# Patient Record
Sex: Male | Born: 1985 | Race: White | Hispanic: No | Marital: Single | Smoking: Current every day smoker
Health system: Southern US, Community
[De-identification: ages and names within clinical notes are randomized; demographics above are authoritative.]

## PROBLEM LIST (undated history)

## (undated) HISTORY — PX: OTHER SURGICAL HISTORY: SHX169

---
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
## 2013-06-26 ENCOUNTER — Emergency Department (HOSPITAL_COMMUNITY)
Admission: EM | Admit: 2013-06-26 | Discharge: 2013-06-27 | Disposition: A | Discharge status: Home / Self Care | Payer: BC Managed Care – PPO | Attending: Emergency Medicine | Admitting: Emergency Medicine

## 2013-06-26 ENCOUNTER — Emergency Department (HOSPITAL_COMMUNITY): Payer: BC Managed Care – PPO

## 2013-06-26 ENCOUNTER — Encounter (HOSPITAL_COMMUNITY): Payer: Self-pay | Admitting: Emergency Medicine

## 2013-06-26 DIAGNOSIS — K828 Other specified diseases of gallbladder: Secondary | ICD-10-CM | POA: Insufficient documentation

## 2013-06-26 DIAGNOSIS — K8050 Calculus of bile duct without cholangitis or cholecystitis without obstruction: Secondary | ICD-10-CM

## 2013-06-26 DIAGNOSIS — K8020 Calculus of gallbladder without cholecystitis without obstruction: Secondary | ICD-10-CM | POA: Insufficient documentation

## 2013-06-26 DIAGNOSIS — F17200 Nicotine dependence, unspecified, uncomplicated: Secondary | ICD-10-CM | POA: Insufficient documentation

## 2013-06-26 LAB — URINALYSIS, ROUTINE W REFLEX MICROSCOPIC
Bilirubin Urine: NEGATIVE
Glucose, UA: NEGATIVE mg/dL
Hgb urine dipstick: NEGATIVE
Ketones, ur: NEGATIVE mg/dL
Leukocytes, UA: NEGATIVE
Nitrite: NEGATIVE
Protein, ur: NEGATIVE mg/dL
Specific Gravity, Urine: 1.027 (ref 1.005–1.030)
Urobilinogen, UA: 1 mg/dL (ref 0.0–1.0)
pH: 6.5 (ref 5.0–8.0)

## 2013-06-26 LAB — CBC WITH DIFFERENTIAL/PLATELET
Basophils Absolute: 0 10*3/uL (ref 0.0–0.1)
Basophils Relative: 0 % (ref 0–1)
Eosinophils Absolute: 0.3 10*3/uL (ref 0.0–0.7)
Eosinophils Relative: 3 % (ref 0–5)
HCT: 44.4 % (ref 39.0–52.0)
Hemoglobin: 16.3 g/dL (ref 13.0–17.0)
Lymphocytes Relative: 28 % (ref 12–46)
Lymphs Abs: 3 10*3/uL (ref 0.7–4.0)
MCH: 32 pg (ref 26.0–34.0)
MCHC: 36.7 g/dL — ABNORMAL HIGH (ref 30.0–36.0)
MCV: 87.2 fL (ref 78.0–100.0)
Monocytes Absolute: 0.8 10*3/uL (ref 0.1–1.0)
Monocytes Relative: 8 % (ref 3–12)
Neutro Abs: 6.4 10*3/uL (ref 1.7–7.7)
Neutrophils Relative %: 61 % (ref 43–77)
Platelets: 210 10*3/uL (ref 150–400)
RBC: 5.09 MIL/uL (ref 4.22–5.81)
RDW: 12.1 % (ref 11.5–15.5)
WBC: 10.5 10*3/uL (ref 4.0–10.5)

## 2013-06-26 LAB — COMPREHENSIVE METABOLIC PANEL
ALT: 92 U/L — ABNORMAL HIGH (ref 0–53)
AST: 52 U/L — ABNORMAL HIGH (ref 0–37)
Albumin: 4.4 g/dL (ref 3.5–5.2)
Alkaline Phosphatase: 91 U/L (ref 39–117)
BUN: 16 mg/dL (ref 6–23)
CO2: 24 mEq/L (ref 19–32)
Calcium: 9.8 mg/dL (ref 8.4–10.5)
Chloride: 100 mEq/L (ref 96–112)
Creatinine, Ser: 1.11 mg/dL (ref 0.50–1.35)
GFR calc Af Amer: >90 mL/min (ref >90)
GFR calc non Af Amer: >90 mL/min (ref >90)
Glucose, Bld: 108 mg/dL — ABNORMAL HIGH (ref 70–99)
Potassium: 3.7 mEq/L (ref 3.7–5.3)
Sodium: 139 mEq/L (ref 137–147)
Total Bilirubin: 0.4 mg/dL (ref 0.3–1.2)
Total Protein: 7.5 g/dL (ref 6.0–8.3)

## 2013-06-26 LAB — LIPASE, BLOOD: Lipase: 18 U/L (ref 11–59)

## 2013-06-26 NOTE — ED Notes (Signed)
Pt reports upper mid abdominal pain for the past week, constant but increasing at night. Pt denies n/v/d. Pt states he has taken gas relief medications with mild relief. PT a&o x4, NAD noted at this time.

## 2013-06-26 NOTE — ED Provider Notes (Signed)
CSN: [id number]     Arrival date & time 06/26/13  2039 History   First MD Initiated Contact with Patient 06/26/13 2106     Chief Complaint  Patient presents with  . Abdominal Pain    (Consider location/radiation/quality/duration/timing/severity/associated sxs/prior Treatment) HPI Comments: Patient is a 28 year old male with no significant past medical history who presents to the emergency department for abdominal pain. Patient states the pain has been present in his epigastric region for the past week. Pain is intermittent in nature, but worsening. Patient states the pain is worse when lying flat and at night. He states that symptoms are sometimes mildly improved when standing upright. He has been taking Gas-X without relief of symptoms. He denies any factors which bring about his symptoms. Patient further denies associated fever, chest pain, shortness of breath, nausea, vomiting, diarrhea, penile swelling or discharge, scrotal swelling or testicular tenderness, dysuria or hematuria, melena or hematochezia, numbness/tingling, weakness, and syncope. Patient denies a history of abdominal surgeries. Patient endorses a normal bowel movement today which was free of blood.  The history is provided by the patient. No language interpreter was used.    History reviewed. No pertinent past medical history. Past Surgical History  Procedure Laterality Date  . Wisdom teeth    . Wisdom tooth extraction Bilateral 2004    x3   History reviewed. No pertinent family history. History  Substance Use Topics  . Smoking status: Current Every Day Smoker -- 0.50 packs/day    Types: Cigarettes  . Smokeless tobacco: Never Used  . Alcohol Use: No     Comment: pt reports quit drinking x1 mo, before drank 6 beers a day    Review of Systems  Constitutional: Negative for fever.  Respiratory: Negative for shortness of breath.   Cardiovascular: Negative for chest pain.  Gastrointestinal: Positive for abdominal pain.  Negative for nausea, vomiting, diarrhea, constipation and blood in stool.  Genitourinary: Negative for dysuria, hematuria, discharge, penile swelling, scrotal swelling, penile pain and testicular pain.  Neurological: Negative for syncope, weakness and numbness.  All other systems reviewed and are negative.     Allergies  Review of patient's allergies indicates no known allergies.  Home Medications   Prior to Admission medications   Not on File   BP 109/55  Pulse 53  Temp(Src) 98.9 F (37.2 C) (Oral)  Resp 20  SpO2 100%  Physical Exam  Nursing note and vitals reviewed. Constitutional: He is oriented to person, place, and time. He appears well-developed and well-nourished. No distress.  Patient in no visible or audible discomfort. He is in no acute distress.  HENT:  Head: Normocephalic and atraumatic.  Mouth/Throat: Oropharynx is clear and moist. No oropharyngeal exudate.  Eyes: Conjunctivae and EOM are normal. Pupils are equal, round, and reactive to light. No scleral icterus.  Neck: Normal range of motion.  Cardiovascular: Normal rate, regular rhythm and normal heart sounds.   Pulmonary/Chest: Effort normal and breath sounds normal. No respiratory distress. He has no wheezes. He has no rales.  Abdominal: Soft. He exhibits no distension and no mass. There is no tenderness. There is no rebound and no guarding.  No tenderness to palpation. No peritoneal signs. Appearance of the abdomen is normal.  Musculoskeletal: Normal range of motion.  Neurological: He is alert and oriented to person, place, and time.  GCS 15. Speech is goal oriented. Patient moves extremities without ataxia.  Skin: Skin is warm and dry. No rash noted. He is not diaphoretic. No erythema. No pallor.  Psychiatric: He has a normal mood and affect. His behavior is normal.    ED Course  Procedures (including critical care time) Labs Review Labs Reviewed  CBC WITH DIFFERENTIAL - Abnormal; Notable for the  following:    MCHC 36.7 (*)    All other components within normal limits  COMPREHENSIVE METABOLIC PANEL - Abnormal; Notable for the following:    Glucose, Bld 108 (*)    AST 52 (*)    ALT 92 (*)    All other components within normal limits  LIPASE, BLOOD  URINALYSIS, ROUTINE W REFLEX MICROSCOPIC    Imaging Review US Abdomen Complete  06/26/2013   CLINICAL DATA:  Epigastric and right upper quadrant pain.  EXAM: ULTRASOUND ABDOMEN COMPLETE  COMPARISON:  None.  FINDINGS: Gallbladder:  Diffusely increased echotexture throughout the gallbladder suggesting sludge filled gallbladder. There is adjacent gallbladder wall thickening and focal edema. No discrete stones are identified, the stones could be obscured by the large amount of sludge. Murphy's sign was given as negative but the patient did note pain and of the xiphoid during scanning the gallbladder.  Common bile duct:  Diameter: 4.9 mm, normal  Liver:  No focal lesion identified. Within normal limits in parenchymal echogenicity.  IVC:  No abnormality visualized.  Pancreas:  Poorly visualized due to overlying bowel gas.  Spleen:  Size and appearance within normal limits.  Right Kidney:  Length: 10.7 cm. Echogenicity within normal limits. No mass or hydronephrosis visualized.  Left Kidney:  Length: 10.2 cm. Echogenicity within normal limits. No mass or hydronephrosis visualized.  Abdominal aorta:  No aneurysm visualized.  Other findings:  None.  IMPRESSION: Sludge filled gallbladder with wall thickening and mild pericholecystic edema. Nonspecific Murphy's sign. Changes are nonspecific but likely represent cholecystitis in the appropriate clinical setting.   Electronically Signed   By: Lucienne Capers M.D.   On: 06/26/2013 23:58     EKG Interpretation None      MDM   Final diagnoses:  Biliary colic  Gallbladder sludge    28 male presents for epigastric pain which has been intermittent for one week. He is well and nontoxic appearing,  hemodynamically stable, and afebrile on arrival. Physical exam without significant findings. Patient has no abdominal tenderness to palpation. No Murphy sign. No tenderness at McBurney point. He has no leukocytosis today. Kidney function preserved. LFTs mildly elevated with normal alkaline phosphatase and total bilirubin. Lipase WNL. Given symptoms and mildly elevated LFTs, abdominal ultrasound obtained. Imaging today shows a slight filled gallbladder with wall thickening and mild pericholecystic edema. Ultrasound also notes a nonspecific sonographic Murphy's sign.  Have consulted with Dr. Brantley Stage of [facility] regarding patient case. I expressed that I believe the patient to be stable for outpatient followup given his lack of pain and tenderness on my abdominal exams today as well as lack of fever, leukocytosis and normal total bili and alk phos. Dr. Brantley Stage agrees with plan and stability for outpatient follow up. Patient to be discharged with referral to [facility] as well as Norco to take as needed for pain control. Return precautions discussed and patient agreeable to plan with no unaddressed concerns.   Filed Vitals:   06/26/13 2045 06/26/13 2343  BP: 116/92 109/55  Pulse: 117 53  Temp: 97.9 F (36.6 C) 98.9 F (37.2 C)  TempSrc: Oral Oral  Resp: 20 20  SpO2: 100% 100%       Antonietta Breach, PA-C 06/27/13 0140

## 2013-06-27 MED ORDER — HYDROCODONE-ACETAMINOPHEN 5-325 MG PO TABS: 1.0000 | ORAL_TABLET | Freq: Four times a day (QID) | ORAL | Status: DC | PRN

## 2013-06-27 NOTE — Discharge Instructions (Signed)

## 2013-06-28 ENCOUNTER — Encounter (INDEPENDENT_AMBULATORY_CARE_PROVIDER_SITE_OTHER): Payer: BC Managed Care – PPO | Admitting: Surgery

## 2013-06-28 ENCOUNTER — Encounter (INDEPENDENT_AMBULATORY_CARE_PROVIDER_SITE_OTHER): Payer: Self-pay | Admitting: Surgery

## 2013-06-28 ENCOUNTER — Ambulatory Visit (INDEPENDENT_AMBULATORY_CARE_PROVIDER_SITE_OTHER): Payer: BC Managed Care – PPO | Admitting: Surgery

## 2013-06-28 VITALS — BP 122/74 | HR 68 | Temp 97.4°F | Ht 70.0 in | Wt 210.6 lb

## 2013-06-28 DIAGNOSIS — K819 Cholecystitis, unspecified: Secondary | ICD-10-CM

## 2013-06-28 DIAGNOSIS — R1013 Epigastric pain: Secondary | ICD-10-CM

## 2013-06-28 NOTE — Patient Instructions (Signed)
  [facility]  LAPAROSCOPIC SURGERY - POST-OP INSTRUCTIONS  Always review your discharge instruction sheet given to you by the facility where your surgery was performed.  A prescription for pain medication may be given to you upon discharge.  Take your pain medication as prescribed.  If narcotic pain medicine is not needed, then you may take acetaminophen (Tylenol) or ibuprofen (Advil) as needed.  Take your usually prescribed medications unless otherwise directed.  If you need a refill on your pain medication, please contact your pharmacy.  They will contact our office to request authorization. Prescriptions will not be filled after 5 P.M. or on weekends.  You should follow a light diet the first few days after arrival home, such as soup and crackers or toast.  Be sure to include plenty of fluids daily.  Most patients will experience some swelling and bruising in the area of the incisions.  Ice packs will help.  Swelling and bruising can take several days to resolve.   It is common to experience some constipation if taking pain medication after surgery.  Increasing fluid intake and taking a stool softener (such as Colace) will usually help or prevent this problem from occurring.  A mild laxative (Milk of Magnesia or Miralax) should be taken according to package instructions if there are no bowel movements after 48 hours.  Unless discharge instructions indicate otherwise, you may remove your bandages 24-48 hours after surgery, and you may shower at that time.  You may have steri-strips (small skin tapes) in place directly over the incision.  These strips should be left on the skin for 7-10 days.  If your surgeon used skin glue on the incision, you may shower in 24 hours.  The glue will flake off over the next 2-3 weeks.  Any sutures or staples will be removed at the office during your follow-up visit.  ACTIVITIES:  You may resume regular (light) daily activities beginning the  next day-such as daily self-care, walking, climbing stairs-gradually increasing activities as tolerated.  You may have sexual intercourse when it is comfortable.  Refrain from any heavy lifting or straining until approved by your doctor.  You may drive when you are no longer taking prescription pain medication, you can comfortably wear a seatbelt, and you can safely maneuver your car and apply brakes.  You should see your doctor in the office for a follow-up appointment approximately 2-3 weeks after your surgery.  Make sure that you call for this appointment within a day or two after you arrive home to insure a convenient appointment time.  WHEN TO CALL YOUR DOCTOR: 1. Fever over 101.0 2. Inability to urinate 3. Continued bleeding from incision 4. Increased pain, redness, or drainage from the incision 5. Increasing abdominal pain  The clinic staff is available to answer your questions during regular business hours.  Please don't hesitate to call and ask to speak to one of the nurses for clinical concerns.  If you have a medical emergency, go to the nearest emergency room or call 911.  A surgeon from [facility] is always on call for the hospital.  Teryl Mcconaghy M. Kriti Katayama, MD, FACS [facility] Office: [phone] Toll Free:  [phone] FAX [phone]  Web site: [email or web address] 

## 2013-06-28 NOTE — ED Provider Notes (Signed)
Medical screening examination/treatment/procedure(s) were performed by non-physician practitioner and as supervising physician I was immediately available for consultation/collaboration.   EKG Interpretation None        Saron Tweed, MD 06/28/13 0702 

## 2013-06-28 NOTE — Progress Notes (Signed)
General Surgery [facility]  Chief Complaint  Patient presents with  . New Evaluation    abdominal pain, cholecystitis - referral from Antony MaduraKelly Humes, PA-C, emergency dept    HISTORY: Patient is a 28 year old male referred from the emergency department with biliary colic and cholecystitis. Patient has had a two-week history of intermittent epigastric and right upper quadrant abdominal pain. Pain occasionally radiates to the back. He denies nausea or vomiting. He has had sweats and chills. Patient was seen in the emergency department on 06/26/2013. Ultrasound demonstrated edema in the gallbladder wall with a large volume of sludge within the gallbladder. Stones were not identified. There was no biliary dilatation. Transaminases were slightly elevated. Total bilirubin and alkaline phosphatase were normal. Patient was referred for consideration for cholecystectomy.  Patient has no past history of hepatobiliary disease. Patient's mother has had cholecystectomy. Patient's father has cholelithiasis. Patient denies jaundice or acholic stools.  History reviewed. No pertinent past medical history.  Current Outpatient Prescriptions  Medication Sig Dispense Refill  . HYDROcodone-acetaminophen (NORCO/VICODIN) 5-325 MG per tablet Take 1-2 tablets by mouth every 6 (six) hours as needed.  13 tablet  0   No current facility-administered medications for this visit.    No Known Allergies  Family History  Problem Relation Age of Onset  . Cancer Father     kidney    History   Social History  . Marital Status: Single    Spouse Name: N/A    Number of Children: N/A  . Years of Education: N/A   Social History Main Topics  . Smoking status: Current Every Day Smoker -- 0.50 packs/day    Types: Cigarettes  . Smokeless tobacco: Never Used  . Alcohol Use: No     Comment: pt reports quit drinking x1 mo, before drank 6 beers a day  . Drug Use: No  . Sexual Activity: Yes   Other  Topics Concern  . None   Social History Narrative  . None    REVIEW OF SYSTEMS - PERTINENT POSITIVES ONLY: Denies jaundice. Denies acholic stools. Intermittent abdominal pain.  EXAM: Filed Vitals:   06/28/13 1457  BP: 122/74  Pulse: 68  Temp: 97.4 F (36.3 C)    GENERAL: well-developed, well-nourished, no acute distress HEENT: normocephalic; pupils equal and reactive; sclerae clear; dentition good; mucous membranes moist NECK:  symmetric on extension; no palpable anterior or posterior cervical lymphadenopathy; no supraclavicular masses; no tenderness CHEST: clear to auscultation bilaterally without rales, rhonchi, or wheezes CARDIAC: regular rate and rhythm without significant murmur; peripheral pulses are full ABDOMEN: soft without distension; bowel sounds present; no mass; no hepatosplenomegaly; no hernia; mild tenderness to deep palpation right upper quadrant EXT:  non-tender without edema; no deformity NEURO: no gross focal deficits; no sign of tremor   LABORATORY RESULTS: See [facility] ([facility]L-Epic) for most recent results  RADIOLOGY RESULTS: See [facility] (CHL-Epic) for most recent results  IMPRESSION: Subacute cholecystitis, sludge in gallbladder  PLAN: I discussed the above findings at length with the patient. We reviewed his ultrasound results. We reviewed his emergency department records. I have recommended laparoscopic cholecystectomy with intraoperative cholangiography. We discussed the procedure at length. I provided him with written literature to review. We discussed the hospital stay to be anticipated. We discussed his recovery and return to work. We discussed restrictions on his activities after the procedure. We discussed the potential for conversion to open surgery. Patient understands and wishes to proceed. We will make arrangements for surgery in  the immediate future as he has remained intermittently symptomatic.  The risks and benefits of the  procedure have been discussed at length with the patient.  The patient understands the proposed procedure, potential alternative treatments, and the course of recovery to be expected.  All of the patient's questions have been answered at this time.  The patient wishes to proceed with surgery.  Velora Hecklerodd M. Yezenia Fredrick, MD, FACS General & Endocrine Surgery [facility]  Primary Care Physician: Sissy HoffSWAYNE,Demetrio W, MD

## 2013-06-30 ENCOUNTER — Other Ambulatory Visit (INDEPENDENT_AMBULATORY_CARE_PROVIDER_SITE_OTHER): Payer: Self-pay

## 2013-06-30 ENCOUNTER — Other Ambulatory Visit (INDEPENDENT_AMBULATORY_CARE_PROVIDER_SITE_OTHER): Payer: Self-pay | Admitting: Surgery

## 2013-06-30 DIAGNOSIS — K8010 Calculus of gallbladder with chronic cholecystitis without obstruction: Secondary | ICD-10-CM

## 2013-06-30 HISTORY — PX: CHOLECYSTECTOMY: SHX55

## 2013-06-30 MED ORDER — HYDROCODONE-ACETAMINOPHEN 5-325 MG PO TABS: 1.0000 | ORAL_TABLET | Freq: Four times a day (QID) | ORAL | Status: AC | PRN

## 2013-07-12 ENCOUNTER — Encounter (INDEPENDENT_AMBULATORY_CARE_PROVIDER_SITE_OTHER): Payer: Self-pay

## 2013-07-12 ENCOUNTER — Encounter (INDEPENDENT_AMBULATORY_CARE_PROVIDER_SITE_OTHER): Payer: Self-pay | Admitting: Surgery

## 2013-07-12 ENCOUNTER — Ambulatory Visit (INDEPENDENT_AMBULATORY_CARE_PROVIDER_SITE_OTHER): Payer: BC Managed Care – PPO | Admitting: Surgery

## 2013-07-12 VITALS — BP 118/78 | HR 71 | Temp 98.2°F | Resp 16 | Ht 70.0 in | Wt 205.2 lb

## 2013-07-12 DIAGNOSIS — R1013 Epigastric pain: Secondary | ICD-10-CM

## 2013-07-12 DIAGNOSIS — K819 Cholecystitis, unspecified: Secondary | ICD-10-CM

## 2013-07-12 NOTE — Progress Notes (Signed)
General Surgery [facility]  Chief Complaint  Patient presents with  . Routine Post Op    lap chole 06/30/2013    HISTORY: Patient is a 28 year old male who underwent laparoscopic cholecystectomy on 06/30/2013. Final pathology shows chronic cholecystitis and cholelithiasis with several small gallstones measuring 1-2 mm in diameter. Postoperatively he has done well. He is tolerating a regular diet. He denies abdominal pain. He denies nausea or vomiting.  EXAM: Abdomen is soft, nontender, without distention. Surgical wounds have healed nicely without sign of infection or seroma. With Valsalva there is no sign of hernia.  IMPRESSION: Status post laparoscopic cholecystectomy for symptomatic cholelithiasis and chronic cholecystitis  PLAN: Patient is released to work without restrictions. He will avoid heavy lifting for the next few weeks. He will apply topical creams to his incisions.  Patient will return for surgical care as needed.  Velora Hecklerodd M. Fahima Cifelli, MD, FACS General & Endocrine Surgery [facility]   Visit Diagnoses: 1. Cholecystitis, unspecified   2. Abdominal pain, epigastric

## 2013-07-12 NOTE — Patient Instructions (Signed)
  CARE OF INCISION   Apply cocoa butter/vitamin E cream (Palmer's brand) to your incision 2 - 3 times daily.  Massage cream into incision for one minute with each application.  Use sunscreen (50 SPF or higher) for first 6 months after surgery if area is exposed to sun.  You may alternate Mederma or other scar reducing cream with cocoa butter cream if desired.       Sierria Bruney M. [place] Stell, MD, FACS      [facility]      Office: [phone]    

## 2013-07-13 ENCOUNTER — Ambulatory Visit (INDEPENDENT_AMBULATORY_CARE_PROVIDER_SITE_OTHER): Payer: BC Managed Care – PPO | Admitting: General Surgery

## 2015-05-19 IMAGING — US US ABDOMEN COMPLETE
1 series · 13 of 25 positions shown · non-contrast
Comparison: None.

CLINICAL DATA: Epigastric and right upper quadrant pain.

EXAM:
ULTRASOUND ABDOMEN COMPLETE

[Series 1: us abdomen complete · 0.27mm/px · 13 of 92 slices shown]
[im 1/92]
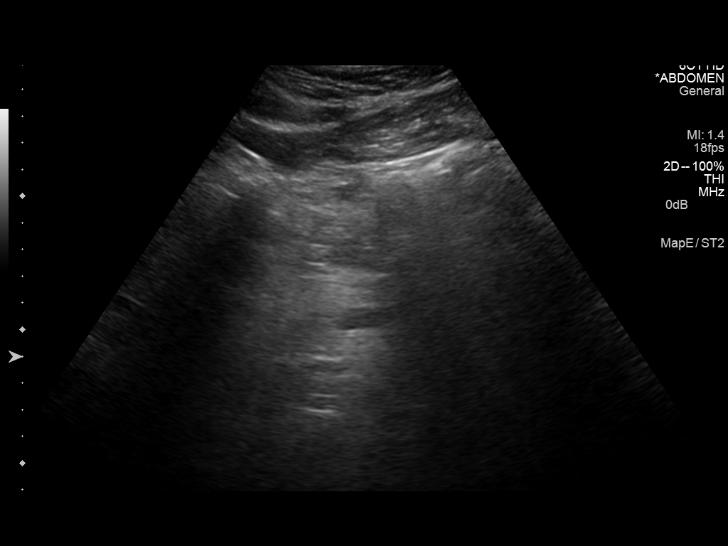
[im 8/92]
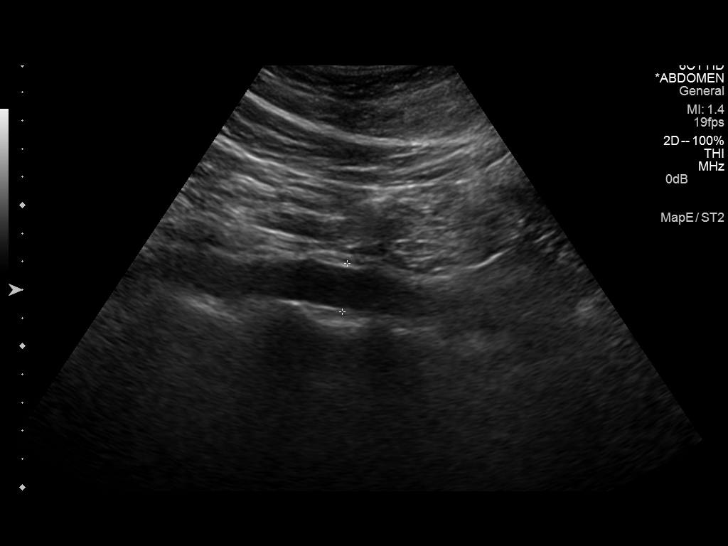
[im 16/92]
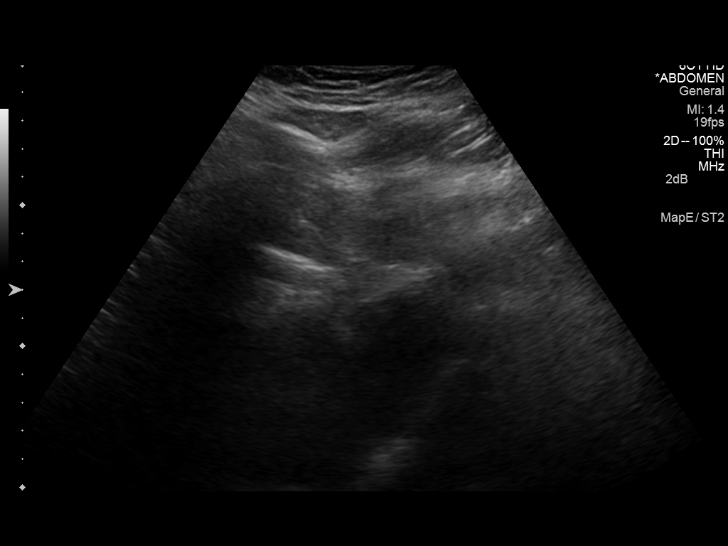
[im 23/92]
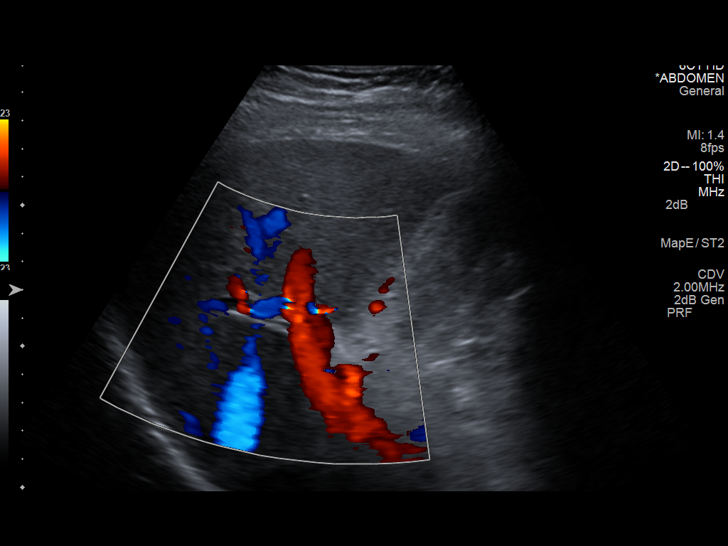
[im 31/92]
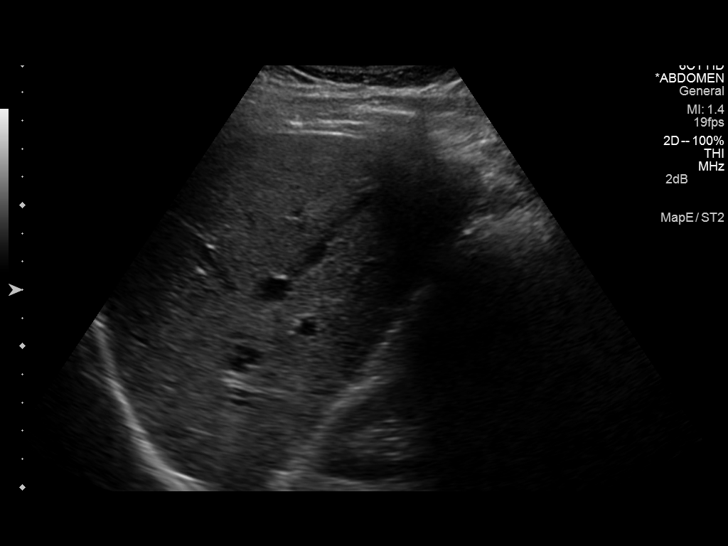
[im 38/92]
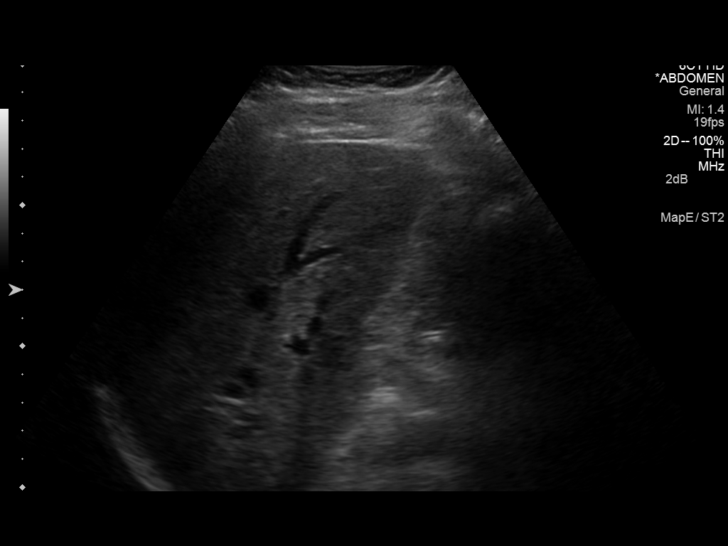
[im 46/92]
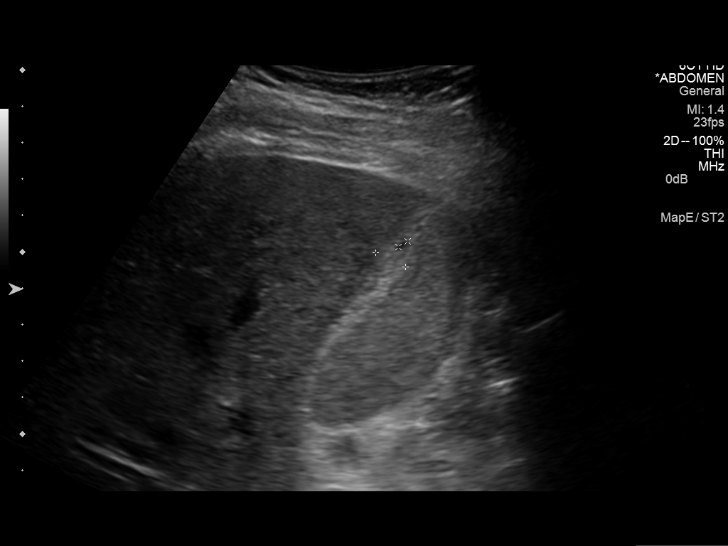
[im 54/92]
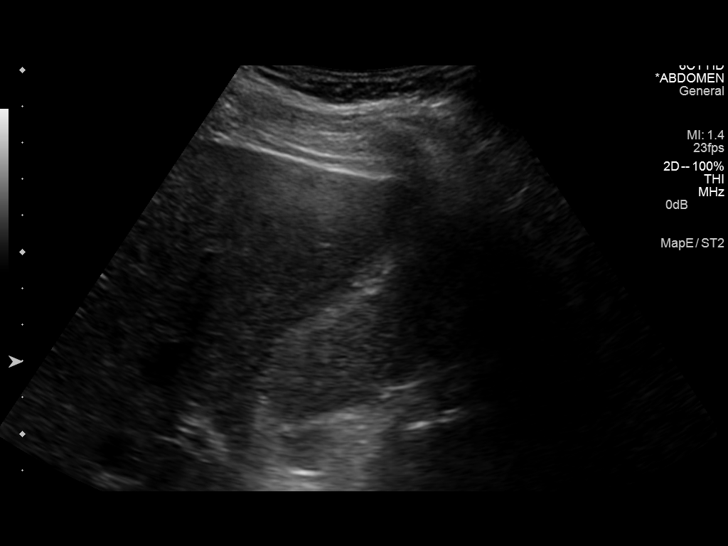
[im 61/92]
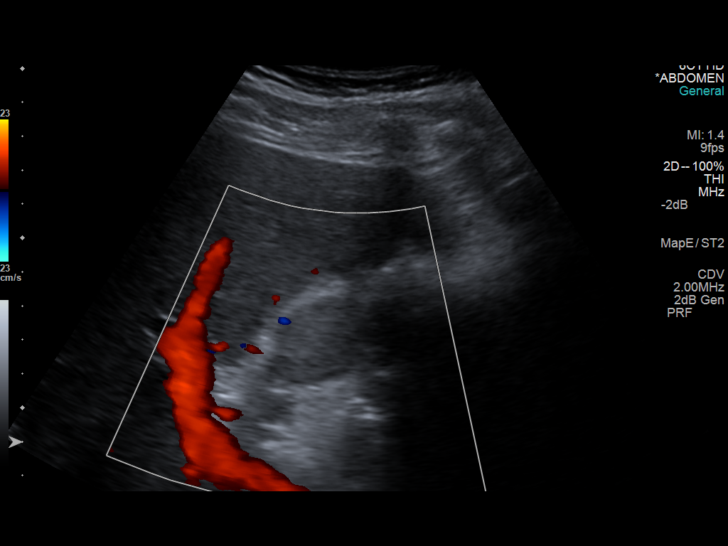
[im 69/92]
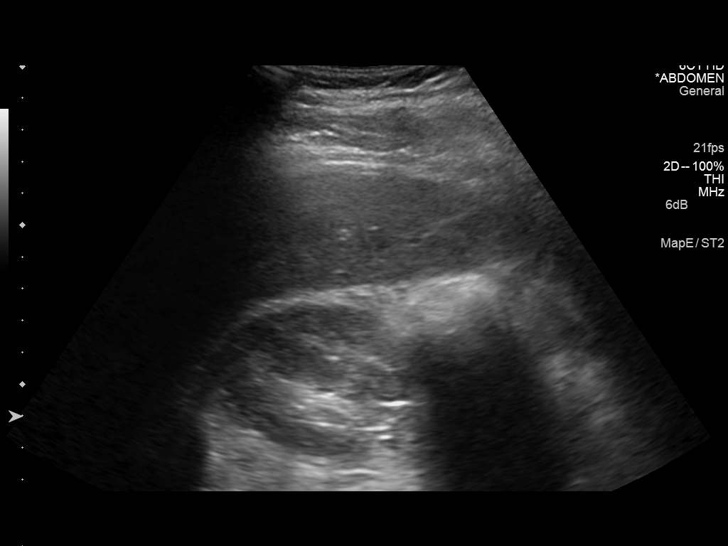
[im 76/92]
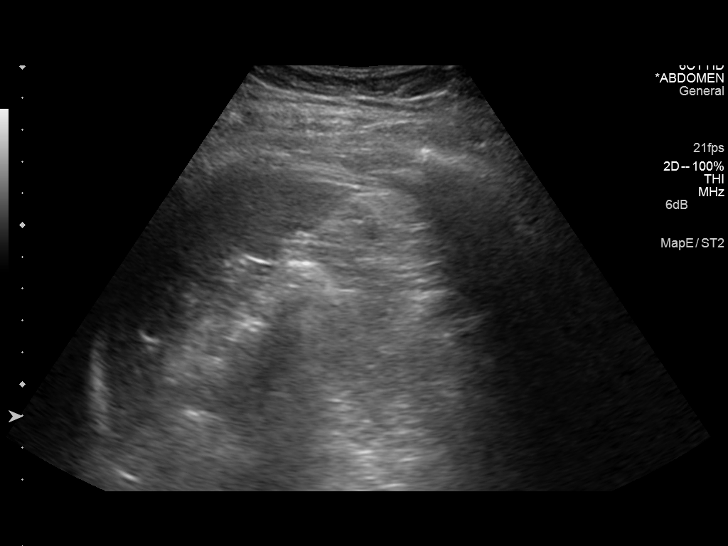
[im 84/92]
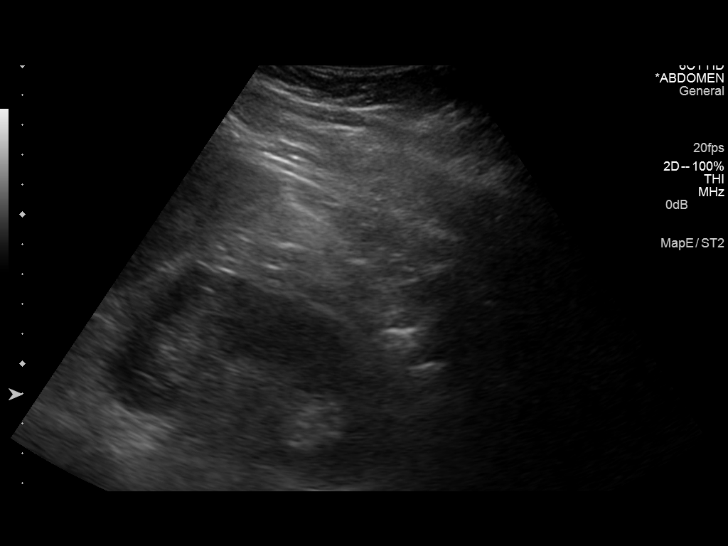
[im 92/92]
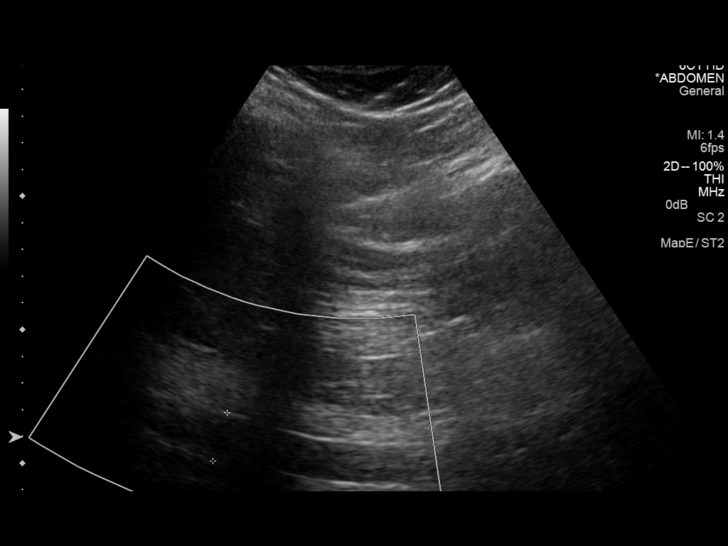

[13 of 25 positions shown; findings below may reference images not displayed]

FINDINGS: Gallbladder:

Diffusely increased echotexture throughout the gallbladder
suggesting sludge filled gallbladder. There is adjacent gallbladder
wall thickening and focal edema. No discrete stones are identified,
the stones could be obscured by the large amount of sludge. Murphy's
sign was given as negative but the patient did note pain and of the
xiphoid during scanning the gallbladder.

Common bile duct:

Diameter: 4.9 mm, normal

Liver:

No focal lesion identified. Within normal limits in parenchymal
echogenicity.

IVC:

No abnormality visualized.

Pancreas:

Poorly visualized due to overlying bowel gas.

Spleen:

Size and appearance within normal limits.

Right Kidney:

Length: 10.7 cm. Echogenicity within normal limits. No mass or
hydronephrosis visualized.

Left Kidney:

Length: 10.2 cm. Echogenicity within normal limits. No mass or
hydronephrosis visualized.

Abdominal aorta:

No aneurysm visualized.

Other findings:

None.
IMPRESSION: Sludge filled gallbladder with wall thickening and mild
pericholecystic edema. Nonspecific Murphy's sign. Changes are
nonspecific but likely represent cholecystitis in the appropriate
clinical setting.

## 2015-08-30 DIAGNOSIS — F430 Acute stress reaction: Secondary | ICD-10-CM | POA: Diagnosis not present

## 2015-09-06 DIAGNOSIS — F430 Acute stress reaction: Secondary | ICD-10-CM | POA: Diagnosis not present

## 2015-09-19 DIAGNOSIS — F430 Acute stress reaction: Secondary | ICD-10-CM | POA: Diagnosis not present

## 2015-10-30 DIAGNOSIS — F419 Anxiety disorder, unspecified: Secondary | ICD-10-CM | POA: Diagnosis not present

## 2016-01-29 DIAGNOSIS — F419 Anxiety disorder, unspecified: Secondary | ICD-10-CM | POA: Diagnosis not present

## 2016-07-29 DIAGNOSIS — F419 Anxiety disorder, unspecified: Secondary | ICD-10-CM | POA: Diagnosis not present

## 2017-02-03 DIAGNOSIS — F419 Anxiety disorder, unspecified: Secondary | ICD-10-CM | POA: Diagnosis not present

## 2017-06-22 DIAGNOSIS — F10230 Alcohol dependence with withdrawal, uncomplicated: Secondary | ICD-10-CM | POA: Diagnosis not present

## 2017-06-22 DIAGNOSIS — Z79899 Other long term (current) drug therapy: Secondary | ICD-10-CM | POA: Diagnosis not present

## 2017-06-22 DIAGNOSIS — F411 Generalized anxiety disorder: Secondary | ICD-10-CM | POA: Diagnosis not present

## 2017-06-22 DIAGNOSIS — F332 Major depressive disorder, recurrent severe without psychotic features: Secondary | ICD-10-CM | POA: Diagnosis not present

## 2017-07-27 DIAGNOSIS — F331 Major depressive disorder, recurrent, moderate: Secondary | ICD-10-CM | POA: Diagnosis not present

## 2017-07-27 DIAGNOSIS — Z79899 Other long term (current) drug therapy: Secondary | ICD-10-CM | POA: Diagnosis not present

## 2017-07-27 DIAGNOSIS — F1020 Alcohol dependence, uncomplicated: Secondary | ICD-10-CM | POA: Diagnosis not present

## 2017-08-24 DIAGNOSIS — Z79899 Other long term (current) drug therapy: Secondary | ICD-10-CM | POA: Diagnosis not present

## 2017-08-24 DIAGNOSIS — F1020 Alcohol dependence, uncomplicated: Secondary | ICD-10-CM | POA: Diagnosis not present

## 2018-02-24 DIAGNOSIS — M545 Low back pain: Secondary | ICD-10-CM | POA: Diagnosis not present

## 2018-04-26 DIAGNOSIS — S60221A Contusion of right hand, initial encounter: Secondary | ICD-10-CM | POA: Diagnosis not present

## 2018-10-02 DIAGNOSIS — F411 Generalized anxiety disorder: Secondary | ICD-10-CM | POA: Diagnosis not present

## 2018-10-23 DIAGNOSIS — F411 Generalized anxiety disorder: Secondary | ICD-10-CM | POA: Diagnosis not present

## 2018-11-10 DIAGNOSIS — F419 Anxiety disorder, unspecified: Secondary | ICD-10-CM | POA: Diagnosis not present

## 2018-11-10 DIAGNOSIS — K921 Melena: Secondary | ICD-10-CM | POA: Diagnosis not present

## 2018-11-20 DIAGNOSIS — F411 Generalized anxiety disorder: Secondary | ICD-10-CM | POA: Diagnosis not present

## 2018-12-08 DIAGNOSIS — K921 Melena: Secondary | ICD-10-CM | POA: Diagnosis not present

## 2018-12-08 DIAGNOSIS — F419 Anxiety disorder, unspecified: Secondary | ICD-10-CM | POA: Diagnosis not present

## 2019-05-02 DIAGNOSIS — R000 Tachycardia, unspecified: Secondary | ICD-10-CM | POA: Diagnosis not present

## 2019-05-02 DIAGNOSIS — R079 Chest pain, unspecified: Secondary | ICD-10-CM | POA: Diagnosis not present

## 2019-05-06 DIAGNOSIS — F410 Panic disorder [episodic paroxysmal anxiety] without agoraphobia: Secondary | ICD-10-CM | POA: Diagnosis not present

## 2019-05-06 DIAGNOSIS — F419 Anxiety disorder, unspecified: Secondary | ICD-10-CM | POA: Diagnosis not present
